# Patient Record
Sex: Female | Born: 1990 | Race: Black or African American | Hispanic: No | Marital: Single | Smoking: Never smoker
Health system: Southern US, Community
[De-identification: ages and names within clinical notes are randomized; demographics above are authoritative.]

## PROBLEM LIST (undated history)

## (undated) DIAGNOSIS — N200 Calculus of kidney: Secondary | ICD-10-CM

## (undated) HISTORY — PX: URETERAL STENT PLACEMENT: SHX822

---

## 2014-03-24 ENCOUNTER — Emergency Department (HOSPITAL_COMMUNITY): Payer: BC Managed Care – PPO

## 2014-03-24 ENCOUNTER — Emergency Department (HOSPITAL_COMMUNITY)
Admission: EM | Admit: 2014-03-24 | Discharge: 2014-03-24 | Disposition: A | Discharge status: Other Acute Inpatient Hospital | Payer: BC Managed Care – PPO | Source: Home / Self Care | Attending: Emergency Medicine | Admitting: Emergency Medicine

## 2014-03-24 ENCOUNTER — Emergency Department (HOSPITAL_COMMUNITY)
Admission: EM | Admit: 2014-03-24 | Discharge: 2014-03-24 | Disposition: A | Discharge status: Home / Self Care | Payer: BC Managed Care – PPO | Attending: Emergency Medicine | Admitting: Emergency Medicine

## 2014-03-24 ENCOUNTER — Encounter (HOSPITAL_COMMUNITY): Payer: Self-pay | Admitting: Emergency Medicine

## 2014-03-24 ENCOUNTER — Encounter (HOSPITAL_COMMUNITY): Payer: Self-pay

## 2014-03-24 DIAGNOSIS — Y9289 Other specified places as the place of occurrence of the external cause: Secondary | ICD-10-CM | POA: Diagnosis not present

## 2014-03-24 DIAGNOSIS — S060X1A Concussion with loss of consciousness of 30 minutes or less, initial encounter: Secondary | ICD-10-CM | POA: Insufficient documentation

## 2014-03-24 DIAGNOSIS — Y998 Other external cause status: Secondary | ICD-10-CM | POA: Insufficient documentation

## 2014-03-24 DIAGNOSIS — Z3202 Encounter for pregnancy test, result negative: Secondary | ICD-10-CM | POA: Insufficient documentation

## 2014-03-24 DIAGNOSIS — W19XXXA Unspecified fall, initial encounter: Secondary | ICD-10-CM

## 2014-03-24 DIAGNOSIS — S0990XA Unspecified injury of head, initial encounter: Secondary | ICD-10-CM | POA: Diagnosis present

## 2014-03-24 DIAGNOSIS — W01198A Fall on same level from slipping, tripping and stumbling with subsequent striking against other object, initial encounter: Secondary | ICD-10-CM | POA: Insufficient documentation

## 2014-03-24 DIAGNOSIS — Y9389 Activity, other specified: Secondary | ICD-10-CM | POA: Diagnosis not present

## 2014-03-24 DIAGNOSIS — G44319 Acute post-traumatic headache, not intractable: Secondary | ICD-10-CM

## 2014-03-24 LAB — POC URINE PREG, ED: Preg Test, Ur: NEGATIVE

## 2014-03-24 MED ORDER — HYDROCODONE-ACETAMINOPHEN 5-325 MG PO TABS
ORAL_TABLET | ORAL | Status: AC
  Filled 2014-03-24: qty 2

## 2014-03-24 MED ORDER — HYDROCODONE-ACETAMINOPHEN 5-325 MG PO TABS
2.0000 | ORAL_TABLET | Freq: Once | ORAL | Status: AC
  Administered 2014-03-24: 2 via ORAL

## 2014-03-24 MED ORDER — PROCHLORPERAZINE EDISYLATE 5 MG/ML IJ SOLN
10.0000 mg | Freq: Four times a day (QID) | INTRAMUSCULAR | Status: DC | PRN
  Administered 2014-03-24: 10 mg via INTRAVENOUS
  Filled 2014-03-24: qty 2

## 2014-03-24 MED ORDER — DIPHENHYDRAMINE HCL 50 MG/ML IJ SOLN
25.0000 mg | Freq: Once | INTRAMUSCULAR | Status: AC
Start: 2014-03-24 — End: 2014-03-24
  Administered 2014-03-24: 25 mg via INTRAVENOUS
  Filled 2014-03-24: qty 1

## 2014-03-24 MED ORDER — SODIUM CHLORIDE 0.9 % IV BOLUS (SEPSIS)
1000.0000 mL | Freq: Once | INTRAVENOUS | Status: AC
  Administered 2014-03-24: 1000 mL via INTRAVENOUS

## 2014-03-24 NOTE — ED Notes (Signed)
States she fell yesterday (" I'm clumsy") and reports a brief LOC due to the fall. This AM woke @ 4 am due to HA. Has not taken any medication for her HA pain ("7")  [facility], free and fluid gait

## 2014-03-24 NOTE — ED Provider Notes (Signed)
   Chief Complaint   Headache   History of Present Illness   Carrie Park is a 24 year old female who tripped and fell forward last night around midnight. She landed on her hands and knees and hit her head. There was a 1-3 minute loss of consciousness. After she woke up, she had a mild headache, but denies any amnesia, foggy thinking, or trouble concentrating. She went to sleep, then woke up at 4 AM with a severe 9/10 headache which reached maximal intensity at onset. Since then her headache is gotten a little better and is now a 7/10. It's located in the frontal area and behind her eyes. There is tenderness to touch but no swelling or bruising. She denies any bleeding or fluid from the nose or ears. No diplopia or blurry vision. She denies any neck pain or stiffness. She's had no paresthesias, numbness, tingling, muscle weakness, or difficulty with speech or ambulation.  Review of Systems   Other than as noted above, the patient denies any of the following symptoms: Eye:  No eye pain, diplopia or blurred vision ENT:  No bleeding from nose or ears.  No loose or broken teeth. Neck:  No pain or limited ROM. GI:  No nausea or vomiting. Neuro:  No loss of consciousness, seizure activity, numbness, tingling, or weakness.  PMFSH   Past medical history, family history, social history, meds, and allergies were reviewed.   Physical Examination   Vital signs:  BP 104/62 mmHg  Pulse 64  Temp(Src) 98.7 F (37.1 C) (Oral) General:  Alert and oriented times 3.  In no distress. Eye:  PERRL, full EOMs.  Lids and conjunctivas normal. Fundi benign. HEENT:  There is tenderness to palpation in the frontal area, no swelling, bruising, or deformity,  TMs and canals normal, nasal mucosa normal.  No oral lacerations.  Teeth were intact without obvious oral trauma. Neck:  Non tender.  Full ROM without pain. Neurological:  Alert and oriented.  Cranial nerves intact.  No pronator drift. Finger to nose test  was normal.  No muscle weakness. DTRs were symmetrical.  Sensation was intact to light touch. Gait was normal.  Romberg's sign negative.  Able to perform tandem gait well.   Course in Urgent Care Center   The following medications were given:  Medications  HYDROcodone-acetaminophen (NORCO/VICODIN) 5-325 MG per tablet 2 tablet    Assessment   The primary encounter diagnosis was Fall, initial encounter. Diagnoses of Head injury, initial encounter and Acute post-traumatic headache, not intractable were also pertinent to this visit.  History and physical are concerning for intracranial injury versus concussion. I think she will need a CT scan. We will transfer via shuttle.  Plan   The patient was transferred to the ED via shuttle in stable condition.  Medical Decision Making:  24 year old female tripped last night and fell forward, striking her head and lost consciousness for 1 to 3 minutes.  She was awakened at 4 a.m. With a severe 9/10 headache, which has gotten a little better throughout the day.  No nausea, vomiting, or neuro symptoms.  On exam she is tender over forehead.  No bruising or swelling.  Neuro exam is normal.  History is concerning for intra cranial injury versus concussion.  Needs CT.        Reuben Likesavid C Chaston Bradburn, MD 03/24/14 [phone]

## 2014-03-24 NOTE — ED Notes (Signed)
EDP at bedside  

## 2014-03-24 NOTE — ED Provider Notes (Signed)
CSN: [id number]     Arrival date & time 03/24/14  1318 History   First MD Initiated Contact with Patient 03/24/14 1633     Chief Complaint  Patient presents with  . Fall  . Headache     (Consider location/radiation/quality/duration/timing/severity/associated sxs/prior Treatment) Patient is a 24 y.o. female presenting with fall and headaches.  Fall Associated symptoms include headaches. Pertinent negatives include no abdominal pain, chest pain, coughing, fever, nausea, neck pain, numbness, rash or sore throat.  Headache Pain location:  Frontal Quality: was sharp, now steadey. Radiates to:  Does not radiate Severity currently:  5/10 Severity at highest:  9/10 Onset quality: woke from sleep. Duration:  12 hours Timing:  Constant Progression:  Unchanged Chronicity:  New Similar to prior headaches: no   Context: bright light   Context: not loud noise   Relieved by:  Nothing Worsened by:  Nothing tried Ineffective treatments:  None tried Associated symptoms: no abdominal pain, no back pain, no blurred vision, no cough, no diarrhea, no dizziness, no facial pain, no fever, no nausea, no neck pain, no numbness, no sore throat, no URI and no weakness   Risk factors: no family hx of SAH     History reviewed. No pertinent past medical history. History reviewed. No pertinent past surgical history. History reviewed. No pertinent family history. History  Substance Use Topics  . Smoking status: Never Smoker   . Smokeless tobacco: Not on file  . Alcohol Use: Yes   OB History    No data available     Review of Systems  Constitutional: Negative for fever.  HENT: Negative for sore throat.   Eyes: Negative for blurred vision and visual disturbance.  Respiratory: Negative for cough and shortness of breath.   Cardiovascular: Negative for chest pain.  Gastrointestinal: Negative for nausea, abdominal pain and diarrhea.  Genitourinary: Negative for difficulty urinating.   Musculoskeletal: Negative for back pain and neck pain.  Skin: Negative for rash.  Neurological: Positive for headaches. Negative for dizziness, syncope and numbness.      Allergies  Bee venom  Home Medications   Prior to Admission medications   Not on File   BP 124/64 mmHg  Pulse 71  Temp(Src) 97.8 F (36.6 C) (Oral)  Resp 18  SpO2 100% Physical Exam  Constitutional: She is oriented to person, place, and time. She appears well-developed and well-nourished. No distress.  HENT:  Head: Normocephalic and atraumatic.  Eyes: Conjunctivae and EOM are normal.  Neck: Normal range of motion.  Cardiovascular: Normal rate, regular rhythm, normal heart sounds and intact distal pulses.  Exam reveals no gallop and no friction rub.   No murmur heard. Pulmonary/Chest: Effort normal and breath sounds normal. No respiratory distress. She has no wheezes. She has no rales.  Abdominal: Soft. She exhibits no distension. There is no tenderness. There is no guarding.  Musculoskeletal: She exhibits no edema or tenderness.  Neurological: She is alert and oriented to person, place, and time. She has normal strength. No cranial nerve deficit or sensory deficit. GCS eye subscore is 4. GCS verbal subscore is 5. GCS motor subscore is 6.  Skin: Skin is warm and dry. No rash noted. She is not diaphoretic. No erythema.  Nursing note and vitals reviewed.   ED Course  Procedures (including critical care time) Labs Review Labs Reviewed - No data to display  Imaging Review No results found.   EKG Interpretation None      MDM   Final diagnoses:  Fall   24 year old female with no significant medical history presents with concern of headache following a fall as a transfer from urgent care. Patient reports a fall last night with loss of consciousness and waking up this morning with severe headache.  Denies other injuries. CT head without any intracranial abnormality. Patient with improvement in  headache after migraine cocktail. Most likely source of headache after fall is concussion. Discussed decreasing screen time and follow-up with primary care physician regarding further concussion management. Patient was discharged in stable condition with understanding of reasons to return.   Alvira Monday, MD 03/25/14 0106  Merrie Roof, MD 03/25/14 [phone]

## 2014-03-24 NOTE — ED Notes (Signed)
[facility] with hitting head and LOC and sts HA starting this am

## 2014-03-24 NOTE — ED Provider Notes (Signed)
I saw and evaluated the patient, reviewed the resident's note and I agree with the findings and plan.   EKG Interpretation None      24 yo female who fell last night striking her head.  Then developed severe headache.  On exam, well appearing, nontoxic, not distressed, normal respiratory effort, normal perfusion, alert, oriented.  Seen at urgent care and sent her for further treatment.  She feels better after migraine cocktail.  CT negative.  Plan dc home.  Clinical Impression: 1. Concussion, with loss of consciousness of 30 minutes or less, initial encounter   2. Fall       Candyce ChurnJohn David Leanne Sisler III, MD 03/24/14 [id number]

## 2014-03-24 NOTE — Discharge Instructions (Signed)
Concussion  A concussion, or closed-head injury, is a brain injury caused by a direct blow to the head or by a quick and sudden movement (jolt) of the head or neck. Concussions are usually not life-threatening. Even so, the effects of a concussion can be serious. If you have had a concussion before, you are more likely to experience concussion-like symptoms after a direct blow to the head.   CAUSES  · Direct blow to the head, such as from running into another player during a soccer game, being hit in a fight, or hitting your head on a hard surface.  · A jolt of the head or neck that causes the brain to move back and forth inside the skull, such as in a car crash.  SIGNS AND SYMPTOMS  The signs of a concussion can be hard to notice. Early on, they may be missed by you, family members, and health care providers. You may look fine but act or feel differently.  Symptoms are usually temporary, but they may last for days, weeks, or even longer. Some symptoms may appear right away while others may not show up for hours or days. Every head injury is different. Symptoms include:  · Mild to moderate headaches that will not go away.  · A feeling of pressure inside your head.  · Having more trouble than usual:  ¨ Learning or remembering things you have heard.  ¨ Answering questions.  ¨ Paying attention or concentrating.  ¨ Organizing daily tasks.  ¨ Making decisions and solving problems.  · Slowness in thinking, acting or reacting, speaking, or reading.  · Getting lost or being easily confused.  · Feeling tired all the time or lacking energy (fatigued).  · Feeling drowsy.  · Sleep disturbances.  ¨ Sleeping more than usual.  ¨ Sleeping less than usual.  ¨ Trouble falling asleep.  ¨ Trouble sleeping (insomnia).  · Loss of balance or feeling lightheaded or dizzy.  · Nausea or vomiting.  · Numbness or tingling.  · Increased sensitivity to:  ¨ Sounds.  ¨ Lights.  ¨ Distractions.  · Vision problems or eyes that tire  easily.  · Diminished sense of taste or smell.  · Ringing in the ears.  · Mood changes such as feeling sad or anxious.  · Becoming easily irritated or angry for little or no reason.  · Lack of motivation.  · Seeing or hearing things other people do not see or hear (hallucinations).  DIAGNOSIS  Your health care provider can usually diagnose a concussion based on a description of your injury and symptoms. He or she will ask whether you passed out (lost consciousness) and whether you are having trouble remembering events that happened right before and during your injury.  Your evaluation might include:  · A brain scan to look for signs of injury to the brain. Even if the test shows no injury, you may still have a concussion.  · Blood tests to be sure other problems are not present.  TREATMENT  · Concussions are usually treated in an emergency department, in urgent care, or at a clinic. You may need to stay in the hospital overnight for further treatment.  · Tell your health care provider if you are taking any medicines, including prescription medicines, over-the-counter medicines, and natural remedies. Some medicines, such as blood thinners (anticoagulants) and aspirin, may increase the chance of complications. Also tell your health care provider whether you have had alcohol or are taking illegal drugs. This information   may affect treatment.  · Your health care provider will send you home with important instructions to follow.  · How fast you will recover from a concussion depends on many factors. These factors include how severe your concussion is, what part of your brain was injured, your age, and how healthy you were before the concussion.  · Most people with mild injuries recover fully. Recovery can take time. In general, recovery is slower in older persons. Also, persons who have had a concussion in the past or have other medical problems may find that it takes longer to recover from their current injury.  HOME  CARE INSTRUCTIONS  General Instructions  · Carefully follow the directions your health care provider gave you.  · Only take over-the-counter or prescription medicines for pain, discomfort, or fever as directed by your health care provider.  · Take only those medicines that your health care provider has approved.  · Do not drink alcohol until your health care provider says you are well enough to do so. Alcohol and certain other drugs may slow your recovery and can put you at risk of further injury.  · If it is harder than usual to remember things, write them down.  · If you are easily distracted, try to do one thing at a time. For example, do not try to watch TV while fixing dinner.  · Talk with family members or close friends when making important decisions.  · Keep all follow-up appointments. Repeated evaluation of your symptoms is recommended for your recovery.  · Watch your symptoms and tell others to do the same. Complications sometimes occur after a concussion. Older adults with a brain injury may have a higher risk of serious complications, such as a blood clot on the brain.  · Tell your teachers, school nurse, school counselor, coach, athletic trainer, or work manager about your injury, symptoms, and restrictions. Tell them about what you can or cannot do. They should watch for:  ¨ Increased problems with attention or concentration.  ¨ Increased difficulty remembering or learning new information.  ¨ Increased time needed to complete tasks or assignments.  ¨ Increased irritability or decreased ability to cope with stress.  ¨ Increased symptoms.  · Rest. Rest helps the brain to heal. Make sure you:  ¨ Get plenty of sleep at night. Avoid staying up late at night.  ¨ Keep the same bedtime hours on weekends and weekdays.  ¨ Rest during the day. Take daytime naps or rest breaks when you feel tired.  · Limit activities that require a lot of thought or concentration. These include:  ¨ Doing homework or job-related  work.  ¨ Watching TV.  ¨ Working on the computer.  · Avoid any situation where there is potential for another head injury (football, hockey, soccer, basketball, martial arts, downhill snow sports and horseback riding). Your condition will get worse every time you experience a concussion. You should avoid these activities until you are evaluated by the appropriate follow-up health care providers.  Returning To Your Regular Activities  You will need to return to your normal activities slowly, not all at once. You must give your body and brain enough time for recovery.  · Do not return to sports or other athletic activities until your health care provider tells you it is safe to do so.  · Ask your health care provider when you can drive, ride a bicycle, or operate heavy machinery. Your ability to react may be slower after a   brain injury. Never do these activities if you are dizzy.  · Ask your health care provider about when you can return to work or school.  Preventing Another Concussion  It is very important to avoid another brain injury, especially before you have recovered. In rare cases, another injury can lead to permanent brain damage, brain swelling, or death. The risk of this is greatest during the first 7-10 days after a head injury. Avoid injuries by:  · Wearing a seat belt when riding in a car.  · Drinking alcohol only in moderation.  · Wearing a helmet when biking, skiing, skateboarding, skating, or doing similar activities.  · Avoiding activities that could lead to a second concussion, such as contact or recreational sports, until your health care provider says it is okay.  · Taking safety measures in your home.  ¨ Remove clutter and tripping hazards from floors and stairways.  ¨ Use grab bars in bathrooms and handrails by stairs.  ¨ Place non-slip mats on floors and in bathtubs.  ¨ Improve lighting in dim areas.  SEEK MEDICAL CARE IF:  · You have increased problems paying attention or  concentrating.  · You have increased difficulty remembering or learning new information.  · You need more time to complete tasks or assignments than before.  · You have increased irritability or decreased ability to cope with stress.  · You have more symptoms than before.  Seek medical care if you have any of the following symptoms for more than 2 weeks after your injury:  · Lasting (chronic) headaches.  · Dizziness or balance problems.  · Nausea.  · Vision problems.  · Increased sensitivity to noise or light.  · Depression or mood swings.  · Anxiety or irritability.  · Memory problems.  · Difficulty concentrating or paying attention.  · Sleep problems.  · Feeling tired all the time.  SEEK IMMEDIATE MEDICAL CARE IF:  · You have severe or worsening headaches. These may be a sign of a blood clot in the brain.  · You have weakness (even if only in one hand, leg, or part of the face).  · You have numbness.  · You have decreased coordination.  · You vomit repeatedly.  · You have increased sleepiness.  · One pupil is larger than the other.  · You have convulsions.  · You have slurred speech.  · You have increased confusion. This may be a sign of a blood clot in the brain.  · You have increased restlessness, agitation, or irritability.  · You are unable to recognize people or places.  · You have neck pain.  · It is difficult to wake you up.  · You have unusual behavior changes.  · You lose consciousness.  MAKE SURE YOU:  · Understand these instructions.  · Will watch your condition.  · Will get help right away if you are not doing well or get worse.  Document Released: 05/21/2003 Document Revised: 03/05/2013 Document Reviewed: 09/20/2012  [employer]® Patient Information ©2015 [employer]. This information is not intended to replace advice given to you by your health care provider. Make sure you discuss any questions you have with your health care provider.

## 2014-03-24 NOTE — Discharge Instructions (Signed)
We have determined that your problem requires further evaluation in the emergency department.  We will take care of your transport there.  Once at the emergency department, you will be evaluated by a provider and they will order whatever treatment or tests they deem necessary.  We cannot guarantee that they will do any specific test or do any specific treatment.  ° °

## 2014-06-23 ENCOUNTER — Emergency Department (HOSPITAL_COMMUNITY)
Admission: EM | Admit: 2014-06-23 | Discharge: 2014-06-24 | Disposition: A | Discharge status: Home / Self Care | Payer: BC Managed Care – PPO | Attending: Emergency Medicine | Admitting: Emergency Medicine

## 2014-06-23 ENCOUNTER — Encounter (HOSPITAL_COMMUNITY): Payer: Self-pay | Admitting: Emergency Medicine

## 2014-06-23 DIAGNOSIS — Z7251 High risk heterosexual behavior: Secondary | ICD-10-CM | POA: Diagnosis not present

## 2014-06-23 DIAGNOSIS — Z87442 Personal history of urinary calculi: Secondary | ICD-10-CM | POA: Insufficient documentation

## 2014-06-23 DIAGNOSIS — R102 Pelvic and perineal pain: Secondary | ICD-10-CM | POA: Diagnosis present

## 2014-06-23 DIAGNOSIS — A6009 Herpesviral infection of other urogenital tract: Secondary | ICD-10-CM | POA: Diagnosis not present

## 2014-06-23 DIAGNOSIS — A6000 Herpesviral infection of urogenital system, unspecified: Secondary | ICD-10-CM

## 2014-06-23 DIAGNOSIS — N390 Urinary tract infection, site not specified: Secondary | ICD-10-CM

## 2014-06-23 DIAGNOSIS — Z3202 Encounter for pregnancy test, result negative: Secondary | ICD-10-CM | POA: Diagnosis not present

## 2014-06-23 HISTORY — DX: Calculus of kidney: N20.0

## 2014-06-23 NOTE — ED Notes (Signed)
Patient c/o vaginal discomfort, states she believes she has herpes. Patient states she had unprotected sex. Has not spoken to this partner to determine if they have any hx.

## 2014-06-24 LAB — URINALYSIS, ROUTINE W REFLEX MICROSCOPIC
Bilirubin Urine: NEGATIVE
Glucose, UA: NEGATIVE mg/dL
Ketones, ur: NEGATIVE mg/dL
NITRITE: POSITIVE — AB
Protein, ur: NEGATIVE mg/dL
SPECIFIC GRAVITY, URINE: 1.025 (ref 1.005–1.030)
UROBILINOGEN UA: 0.2 mg/dL (ref 0.0–1.0)
pH: 6 (ref 5.0–8.0)

## 2014-06-24 LAB — WET PREP, GENITAL
TRICH WET PREP: NONE SEEN
Yeast Wet Prep HPF POC: NONE SEEN

## 2014-06-24 LAB — HIV ANTIBODY (ROUTINE TESTING W REFLEX): HIV Screen 4th Generation wRfx: NONREACTIVE

## 2014-06-24 LAB — URINE MICROSCOPIC-ADD ON

## 2014-06-24 LAB — I-STAT BETA HCG BLOOD, ED (MC, WL, AP ONLY): I-stat hCG, quantitative: <5 m[IU]/mL (ref <5)

## 2014-06-24 LAB — GC/CHLAMYDIA PROBE AMP (~~LOC~~) NOT AT ARMC
CHLAMYDIA, DNA PROBE: NEGATIVE
Neisseria Gonorrhea: NEGATIVE

## 2014-06-24 LAB — RPR: RPR Ser Ql: NONREACTIVE

## 2014-06-24 MED ORDER — CEFTRIAXONE SODIUM 250 MG IJ SOLR
250.0000 mg | Freq: Once | INTRAMUSCULAR | Status: AC
  Administered 2014-06-24: 250 mg via INTRAMUSCULAR
  Filled 2014-06-24: qty 250

## 2014-06-24 MED ORDER — NAPROXEN 500 MG PO TABS: 500.0000 mg | ORAL_TABLET | Freq: Two times a day (BID) | ORAL | Status: AC

## 2014-06-24 MED ORDER — AZITHROMYCIN 250 MG PO TABS
1000.0000 mg | ORAL_TABLET | Freq: Once | ORAL | Status: AC
  Administered 2014-06-24: 1000 mg via ORAL
  Filled 2014-06-24: qty 4

## 2014-06-24 MED ORDER — CEPHALEXIN 500 MG PO CAPS: 500.0000 mg | ORAL_CAPSULE | Freq: Two times a day (BID) | ORAL | Status: AC

## 2014-06-24 MED ORDER — VALACYCLOVIR HCL 500 MG PO TABS: 1000.0000 mg | ORAL_TABLET | Freq: Two times a day (BID) | ORAL | Status: AC

## 2014-06-24 MED ORDER — LIDOCAINE HCL 1 % IJ SOLN
INTRAMUSCULAR | Status: AC
  Administered 2014-06-24: 03:00:00
  Filled 2014-06-24: qty 20

## 2014-06-24 NOTE — Discharge Instructions (Signed)
Do not engage in sexual intercourse for one week or at least until your herpes outbreak has resolved, whichever is longer. Notify all sexual partners of the need to be tested and treated for STDs. They may have this performed at the health department in [place]. Follow-up with the Health Department regarding the results of your STD testing. Take Keflex for urinary tract infection and valacyclovir for genital herpes outbreak. Take naproxen as needed for pain. Follow-up with the women's outpatient clinic for further OB/GYN care and follow-up. Return to the emergency department as needed if symptoms worsen.  Urinary Tract Infection Urinary tract infections (UTIs) can develop anywhere along your urinary tract. Your urinary tract is your body's drainage system for removing wastes and extra water. Your urinary tract includes two kidneys, two ureters, a bladder, and a urethra. Your kidneys are a pair of bean-shaped organs. Each kidney is about the size of your fist. They are located below your ribs, one on each side of your spine. CAUSES Infections are caused by microbes, which are microscopic organisms, including fungi, viruses, and bacteria. These organisms are so small that they can only be seen through a microscope. Bacteria are the microbes that most commonly cause UTIs. SYMPTOMS  Symptoms of UTIs may vary by age and gender of the patient and by the location of the infection. Symptoms in young women typically include a frequent and intense urge to urinate and a painful, burning feeling in the bladder or urethra during urination. Older women and men are more likely to be tired, shaky, and weak and have muscle aches and abdominal pain. A fever may mean the infection is in your kidneys. Other symptoms of a kidney infection include pain in your back or sides below the ribs, nausea, and vomiting. DIAGNOSIS To diagnose a UTI, your caregiver will ask you about your symptoms. Your caregiver also will ask to  provide a urine sample. The urine sample will be tested for bacteria and white blood cells. White blood cells are made by your body to help fight infection. TREATMENT  Typically, UTIs can be treated with medication. Because most UTIs are caused by a bacterial infection, they usually can be treated with the use of antibiotics. The choice of antibiotic and length of treatment depend on your symptoms and the type of bacteria causing your infection. HOME CARE INSTRUCTIONS  If you were prescribed antibiotics, take them exactly as your caregiver instructs you. Finish the medication even if you feel better after you have only taken some of the medication.  Drink enough water and fluids to keep your urine clear or pale yellow.  Avoid caffeine, tea, and carbonated beverages. They tend to irritate your bladder.  Empty your bladder often. Avoid holding urine for long periods of time.  Empty your bladder before and after sexual intercourse.  After a bowel movement, women should cleanse from front to back. Use each tissue only once. SEEK MEDICAL CARE IF:   You have back pain.  You develop a fever.  Your symptoms do not begin to resolve within 3 days. SEEK IMMEDIATE MEDICAL CARE IF:   You have severe back pain or lower abdominal pain.  You develop chills.  You have nausea or vomiting.  You have continued burning or discomfort with urination. MAKE SURE YOU:   Understand these instructions.  Will watch your condition.  Will get help right away if you are not doing well or get worse. Document Released: 12/08/2004 Document Revised: 08/30/2011 Document Reviewed: 04/08/2011 [employer] Patient Information  2015 [employer]. This information is not intended to replace advice given to you by your health care provider. Make sure you discuss any questions you have with your health care provider.  Genital Herpes Genital herpes is a sexually transmitted disease. This means that it is a disease  passed by having sex with an infected person. There is no cure for genital herpes. The time between attacks can be months to years. The virus may live in a person but produce no problems (symptoms). This infection can be passed to a baby as it travels down the birth canal (vagina). In a newborn, this can cause central nervous system damage, eye damage, or even death. The virus that causes genital herpes is usually HSV-2 virus. The virus that causes oral herpes is usually HSV-1. The diagnosis (learning what is wrong) is made through culture results. SYMPTOMS  Usually symptoms of pain and itching begin a few days to a week after contact. It first appears as small blisters that progress to small painful ulcers which then scab over and heal after several days. It affects the outer genitalia, birth canal, cervix, penis, anal area, buttocks, and thighs. HOME CARE INSTRUCTIONS   Keep ulcerated areas dry and clean.  Take medications as directed. Antiviral medications can speed up healing. They will not prevent recurrences or cure this infection. These medications can also be taken for suppression if there are frequent recurrences.  While the infection is active, it is contagious. Avoid all sexual contact during active infections.  Condoms may help prevent spread of the herpes virus.  Practice safe sex.  Wash your hands thoroughly after touching the genital area.  Avoid touching your eyes after touching your genital area.  Inform your caregiver if you have had genital herpes and become pregnant. It is your responsibility to insure a safe outcome for your baby in this pregnancy.  Only take over-the-counter or prescription medicines for pain, discomfort, or fever as directed by your caregiver. SEEK MEDICAL CARE IF:   You have a recurrence of this infection.  You do not respond to medications and are not improving.  You have new sources of pain or discharge which have changed from the original  infection.  You have an oral temperature above 102 F (38.9 C).  You develop abdominal pain.  You develop eye pain or signs of eye infection. Document Released: 02/26/2000 Document Revised: 05/23/2011 Document Reviewed: 03/18/2009 [facility] Patient Information 2015 [place] [place]. This information is not intended to replace advice given to you by your health care provider. Make sure you discuss any questions you have with your health care provider.  Safe Sex Safe sex is about reducing the risk of giving or getting a sexually transmitted disease (STD). STDs are spread through sexual contact involving the genitals, mouth, or rectum. Some STDs can be cured and others cannot. Safe sex can also prevent unintended pregnancies.  WHAT ARE SOME SAFE SEX PRACTICES?  Limit your sexual activity to only one partner who is having sex with only you.  Talk to your partner about his or her past partners, past STDs, and drug use.  Use a condom every time you have sexual intercourse. This includes vaginal, oral, and anal sexual activity. Both females and males should wear condoms during oral sex. Only use latex or polyurethane condoms and water-based lubricants. Using petroleum-based lubricants or oils to lubricate a condom will weaken the condom and increase the chance that it will break. The condom should be in place from the  beginning to the end of sexual activity. Wearing a condom reduces, but does not completely eliminate, your risk of getting or giving an STD. STDs can be spread by contact with infected body fluids and skin.  Get vaccinated for hepatitis B and HPV.  Avoid alcohol and recreational drugs, which can affect your judgment. You may forget to use a condom or participate in high-risk sex.  For females, avoid douching after sexual intercourse. Douching can spread an infection farther into the reproductive tract.  Check your body for signs of sores, blisters, rashes, or unusual discharge. See  your health care provider if you notice any of these signs.  Avoid sexual contact if you have symptoms of an infection or are being treated for an STD. If you or your partner has herpes, avoid sexual contact when blisters are present. Use condoms at all other times.  If you are at risk of being infected with HIV, it is recommended that you take a prescription medicine daily to prevent HIV infection. This is called pre-exposure prophylaxis (PrEP). You are considered at risk if:  You are a man who has sex with other men (MSM).  You are a heterosexual man or woman who is sexually active with more than one partner.  You take drugs by injection.  You are sexually active with a partner who has HIV.  Talk with your health care provider about whether you are at high risk of being infected with HIV. If you choose to begin PrEP, you should first be tested for HIV. You should then be tested every 3 months for as long as you are taking PrEP.  See your health care provider for regular screenings, exams, and tests for other STDs. Before having sex with a new partner, each of you should be screened for STDs and should talk about the results with each other. WHAT ARE THE BENEFITS OF SAFE SEX?   There is less chance of getting or giving an STD.  You can prevent unwanted or unintended pregnancies.  By discussing safe sex concerns with your partner, you may increase feelings of intimacy, comfort, trust, and honesty between the two of you. Document Released: 04/07/2004 Document Revised: 07/15/2013 Document Reviewed: 08/22/2011 [facility] Patient Information 2015 [place], [place]. This information is not intended to replace advice given to you by your health care provider. Make sure you discuss any questions you have with your health care provider.

## 2014-06-24 NOTE — ED Provider Notes (Signed)
CSN: [id number]     Arrival date & time 06/23/14  2227 History   First MD Initiated Contact with Patient 06/24/14 0101     Chief Complaint  Patient presents with  . Vaginal Pain    (Consider location/radiation/quality/duration/timing/severity/associated sxs/prior Treatment) HPI Comments: Patient is a 24 year old female with a history of kidney stones who presents to the emergency department for further evaluation of vaginal discomfort. Patient states that she has been experiencing earning, itching bumps in her vaginal region 5 days. Patient also reports creamy white discharge. She states that she has some discomfort when urinating, but denies burning dysuria. She believes the discomfort is from her urine touching the area where she has her bumps. Patient denies taking any medications prior to arrival. She does report a history of chlamydia. She has been sexually active with 2 men in the last 6 months, one of whom she does not use condoms with. She denies a history of syphilis or HIV. No reported fevers, abdominal pain, nausea, vomiting, or hematuria.  Patient is a 24 y.o. female presenting with vaginal pain. The history is provided by the patient. No language interpreter was used.  Vaginal Pain    Past Medical History  Diagnosis Date  . Kidney stones    Past Surgical History  Procedure Laterality Date  . Ureteral stent placement      & removal   No family history on file. History  Substance Use Topics  . Smoking status: Never Smoker   . Smokeless tobacco: Not on file  . Alcohol Use: Yes     Comment: soc   OB History    No data available      Review of Systems  Genitourinary: Positive for vaginal discharge and vaginal pain. Negative for vaginal bleeding.  All other systems reviewed and are negative.   Allergies  Bee venom  Home Medications   Prior to Admission medications   Medication Sig Start Date End Date Taking? Authorizing Provider  ibuprofen (ADVIL,MOTRIN) 200  MG tablet Take 200 mg by mouth every 6 (six) hours as needed for headache (headache).   Yes Historical Provider, MD  cephALEXin (KEFLEX) 500 MG capsule Take 1 capsule (500 mg total) by mouth 2 (two) times daily. 06/24/14   Antony Madura, PA-C  naproxen (NAPROSYN) 500 MG tablet Take 1 tablet (500 mg total) by mouth 2 (two) times daily. 06/24/14   Antony Madura, PA-C  valACYclovir (VALTREX) 500 MG tablet Take 2 tablets (1,000 mg total) by mouth 2 (two) times daily. 06/24/14   Antony Madura, PA-C   BP 96/54 mmHg  Pulse 93  Temp(Src) 98.1 F (36.7 C) (Oral)  Resp 16  Ht  (1.702 m)  Wt 140 lb (63.504 kg)  BMI 21.92 kg/m2  SpO2 98%  LMP 06/10/2014 (Exact Date)   Physical Exam  Constitutional: She is oriented to person, place, and time. She appears well-developed and well-nourished. No distress.  Nontoxic/nonseptic appearing  HENT:  Head: Normocephalic and atraumatic.  Eyes: Conjunctivae and EOM are normal. No scleral icterus.  Neck: Normal range of motion.  Pulmonary/Chest: Effort normal. No respiratory distress. She has no wheezes.  Respirations even and unlabored  Abdominal: Soft. She exhibits no distension. There is no tenderness.  Soft, nontender abdomen. No masses or peritoneal signs  Genitourinary: There is lesion on the right labia. There is lesion on the left labia. Uterus is not tender. Cervix exhibits discharge and friability. Cervix exhibits no motion tenderness. Right adnexum displays no mass, no tenderness and  no fullness. Left adnexum displays no mass, no tenderness and no fullness. No tenderness in the vagina. No signs of injury around the vagina. Vaginal discharge found.  Multiple ulcerated lesions noted to the labia majora and minora which are TTP. No distinct vesicles noted. Copious white, opaque discharge in the vaginal vault. No CMT or adnexal TTP.  Musculoskeletal: Normal range of motion.  Neurological: She is alert and oriented to person, place, and time. She exhibits normal  muscle tone. Coordination normal.  GCS 15. Patient moves extremities without ataxia  Skin: Skin is warm and dry. No rash noted. She is not diaphoretic. No erythema. No pallor.  Psychiatric: She has a normal mood and affect. Her behavior is normal.  Nursing note and vitals reviewed.   ED Course  Procedures (including critical care time) Labs Review Labs Reviewed  WET PREP, GENITAL - Abnormal; Notable for the following:    Clue Cells Wet Prep HPF POC FEW (*)    WBC, Wet Prep HPF POC FEW (*)    All other components within normal limits  URINALYSIS, ROUTINE W REFLEX MICROSCOPIC - Abnormal; Notable for the following:    APPearance CLOUDY (*)    Hgb urine dipstick TRACE (*)    Nitrite POSITIVE (*)    Leukocytes, UA MODERATE (*)    All other components within normal limits  URINE MICROSCOPIC-ADD ON - Abnormal; Notable for the following:    Bacteria, UA MANY (*)    All other components within normal limits  URINE CULTURE  HSV 2 ANTIBODY, IGG  RPR  HIV ANTIBODY (ROUTINE TESTING)  I-STAT BETA HCG BLOOD, ED ([facility], [facility], [facility] ONLY)  GC/CHLAMYDIA PROBE AMP ([place])    Imaging Review No results found.   EKG Interpretation None      MDM   Final diagnoses:  UTI (lower urinary tract infection)  Genital herpes  History of unprotected sex    24 year old female presents to the emergency department for further evaluation of vaginal lesions that are burning and painful. Physical exam today concerning for genital herpes. Patient also reporting a burning with urination. Urinalysis consistent with UTI. No fever, abdominal pain or tenderness, nausea, or vomiting to suggest complicating process. Given patient's history of unprotected sexual intercourse, she was treated in the ED with Rocephin and azithromycin. Urine sent for culture and patient to be given course of Keflex for UTI. Patient given course of valacyclovir for treatment of genital herpes outbreak. This is the patient's first episode  of genital herpes. Patient advised to follow-up with the Health Department regarding the results of her STD tests and to refrain from sexual intercourse for one week. Return precautions discussed and provided. Patient agreeable to plan with no unaddressed concerns. Patient discharged in good condition.   Filed Vitals:   06/23/14 2236 06/24/14 0150 06/24/14 0340  BP: 114/53 100/58 96/54  Pulse: 95 78 93  Temp: 98.2 F (36.8 C) 98.1 F (36.7 C)   TempSrc: Oral Oral   Resp: 16 19 16   Height: 5\' 7"  (1.702 m)    Weight: 140 lb (63.504 kg)    SpO2: 99% 99% 98%     Antony MaduraKelly Alicea Wente, PA-C 06/24/14 0502  Elwin MochaBlair Walden, MD 06/25/14 [phone]

## 2014-06-24 NOTE — ED Notes (Signed)
Pt unable to urinate at this time.  

## 2014-06-24 NOTE — ED Notes (Signed)
Bed: WA21 Expected date:  Expected time:  Means of arrival:  Comments: 

## 2014-06-25 LAB — HSV 2 ANTIBODY, IGG: HSV 2 Glycoprotein G Ab, IgG: <0.91 index (ref 0.00–0.90)

## 2014-06-27 LAB — URINE CULTURE: Colony Count: >=100000

## 2014-06-30 ENCOUNTER — Telehealth (HOSPITAL_COMMUNITY): Payer: Self-pay

## 2014-06-30 NOTE — Telephone Encounter (Signed)
Post ED Visit - Positive Culture Follow-up  Culture report reviewed by antimicrobial stewardship pharmacist: []  Wes Dulaney, Pharm.D., BCPS []  Celedonio MiyamotoJeremy Frens, Pharm.D., BCPS []  Georgina PillionElizabeth Martin, Pharm.D., BCPS [x]  FairburyMinh Pham, [address]Pharm[address]D., BCPS, AAHIVP []  Estella HuskMichelle Turner, Pharm.D., BCPS, AAHIVP []  Elder CyphersLorie Poole, [address]Pharm[address]D., BCPS  Positive Urine culture, >/= 100,000 colonies -> E Coli Treated with Cephalexin, organism sensitive to the same and no further patient follow-up is required at this time.  Arvid RightClark, Renetta Suman Dorn 06/30/2014, 3:23 AM

## 2016-08-02 IMAGING — CT CT HEAD W/O CM
2 series · 16 of 30 positions shown, 18 images · non-contrast
Comparison: None.

CLINICAL DATA: Fell yesterday. Hit head. Loss consciousness.
Headache started this morning.

EXAM:
CT HEAD WITHOUT CONTRAST
TECHNIQUE: Contiguous axial images were obtained from the base of the skull
through the vertex without intravenous contrast.

[Series 201: head w/o, idose (1) · axial · non-contrast · 0.49mm/px · z∈[+111,+241]mm · 8 of 34 slices shown, 10 images]
[im 4/34  brain]
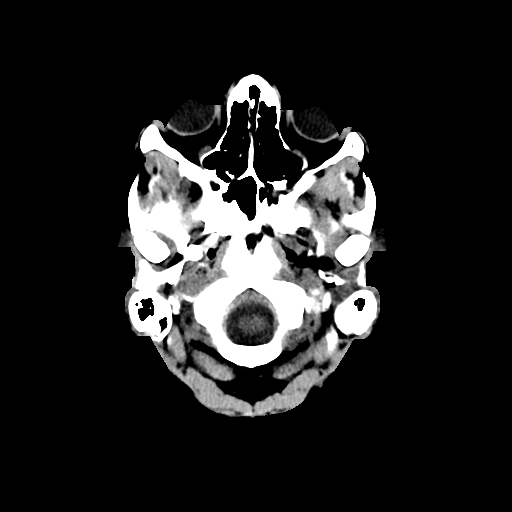
[im 4/34  bone]
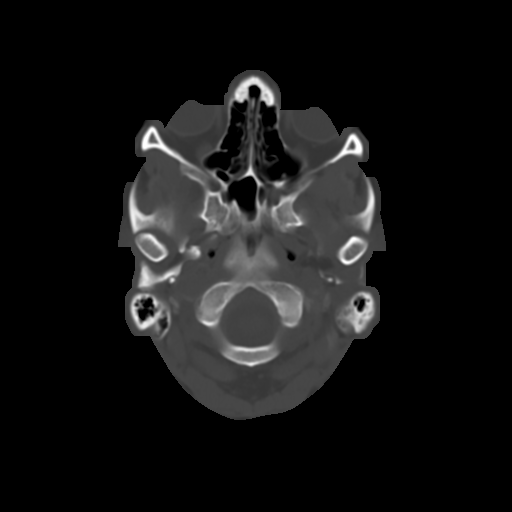
[im 8/34  brain]
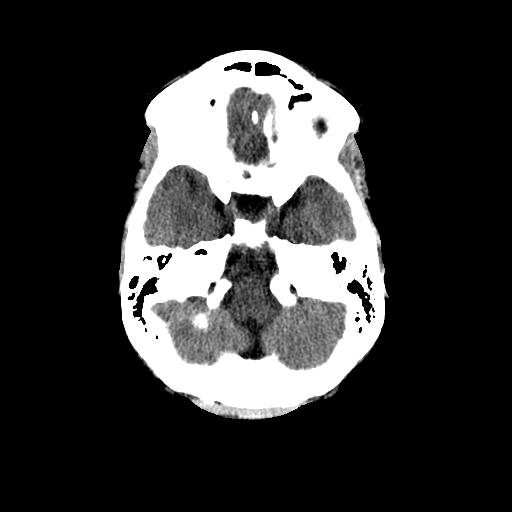
[im 12/34  brain]
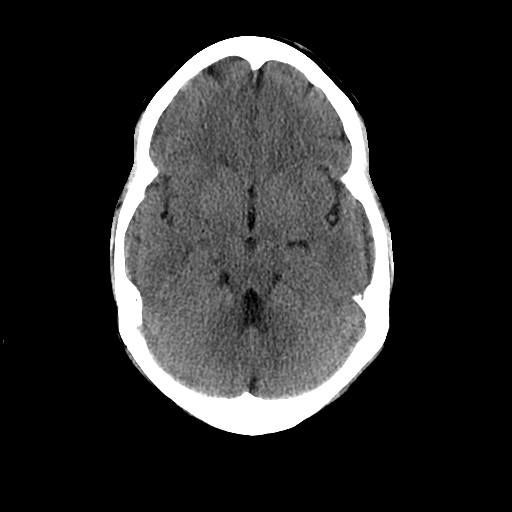
[im 15/34  brain]
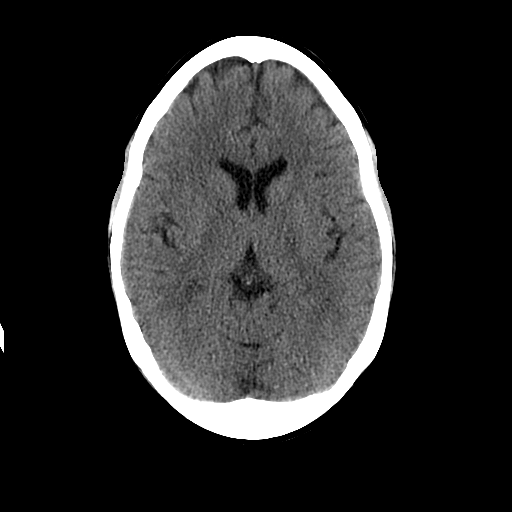
[im 19/34  brain]
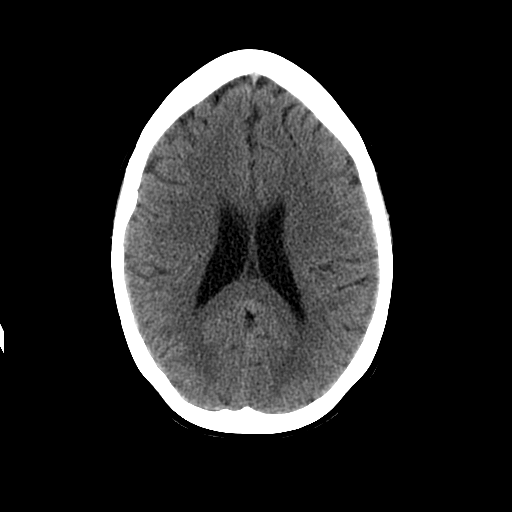
[im 19/34  bone]
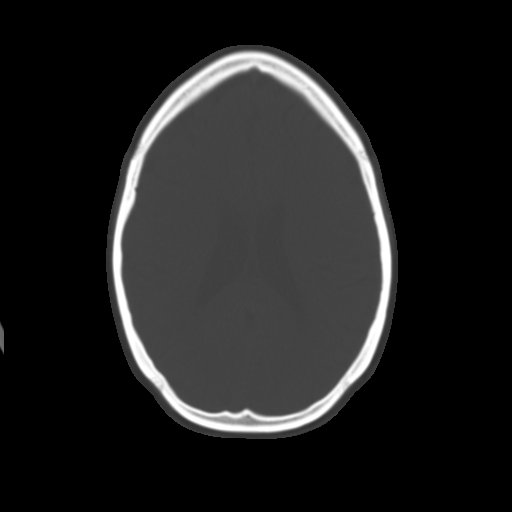
[im 23/34  brain]
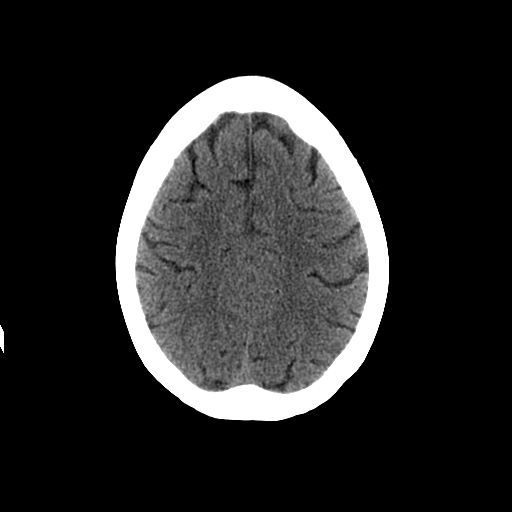
[im 26/34  brain]
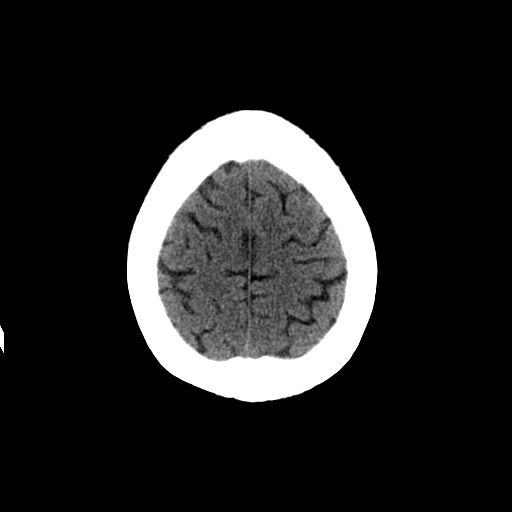
[im 30/34  brain]
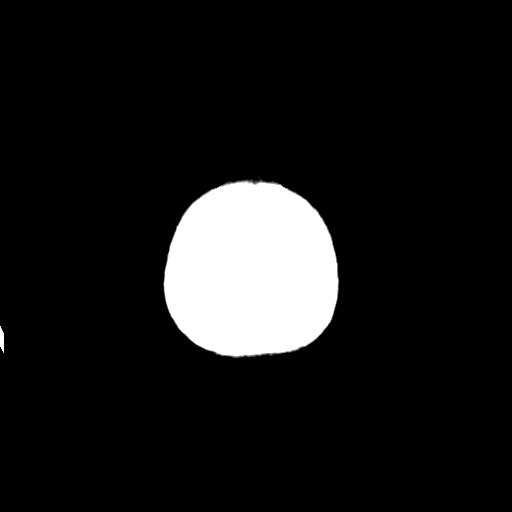

[Series 202: head w/o bone, idose (1) · axial · non-contrast · 0.49mm/px · z∈[+113,+243]mm · 8 of 68 slices shown]
[im 8/68  bone]
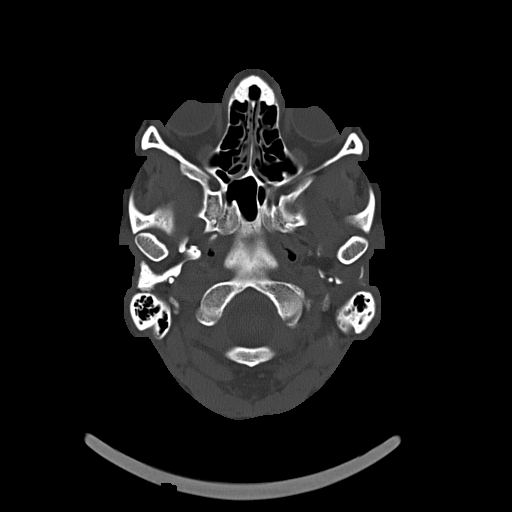
[im 15/68  bone]
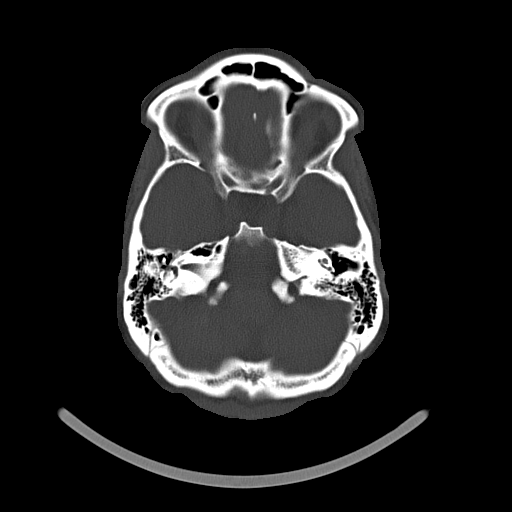
[im 22/68  bone]
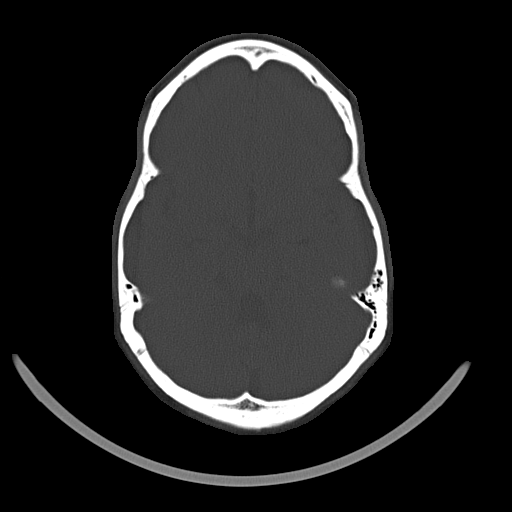
[im 29/68  bone]
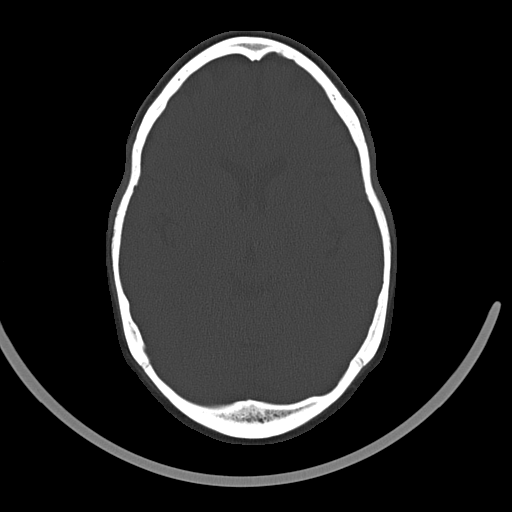
[im 39/68  bone]
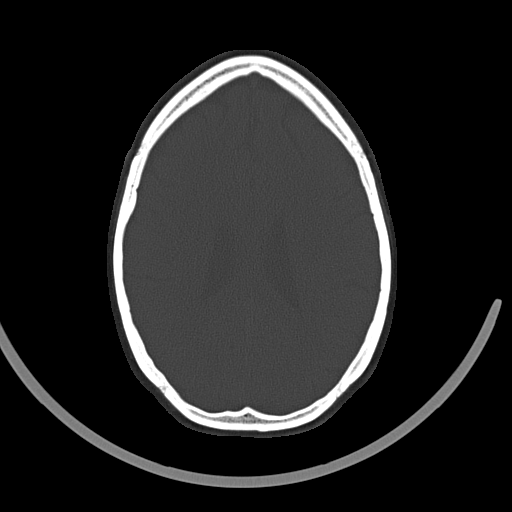
[im 46/68  bone]
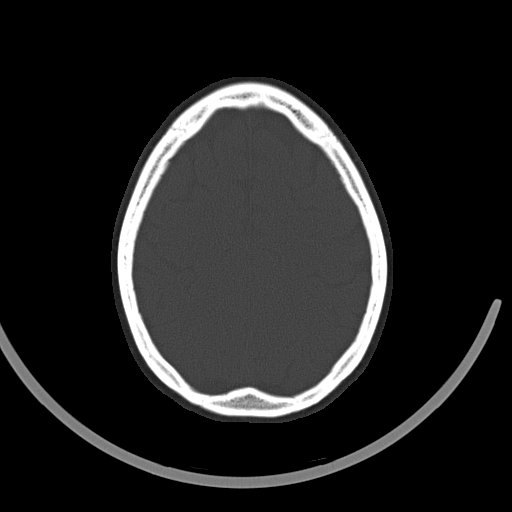
[im 53/68  bone]
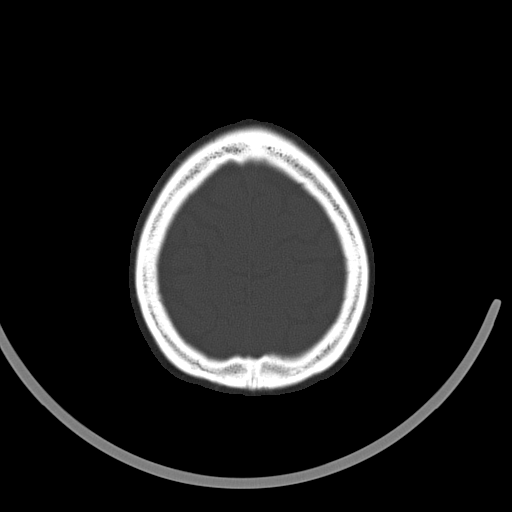
[im 60/68  bone]
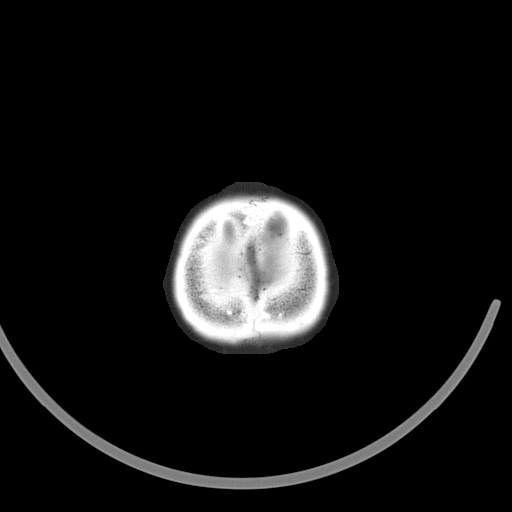

[16 of 30 positions shown; findings below may reference images not displayed]

FINDINGS: There is no intra or extra-axial fluid collection or mass lesion.
The basilar cisterns and ventricles have a normal appearance. There
is no CT evidence for acute infarction or hemorrhage. Bone windows
are unremarkable.
IMPRESSION: Negative exam.
